# Patient Record
Sex: Male | Born: 2000 | Race: White | Hispanic: No | Marital: Single | Smoking: Current every day smoker
Health system: Southern US, Community
[De-identification: ages and names within clinical notes are randomized; demographics above are authoritative.]

## PROBLEM LIST (undated history)

## (undated) DIAGNOSIS — J45909 Unspecified asthma, uncomplicated: Secondary | ICD-10-CM

## (undated) HISTORY — PX: TONSILLECTOMY: SUR1361

---
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
## 2019-09-28 DIAGNOSIS — Z419 Encounter for procedure for purposes other than remedying health state, unspecified: Secondary | ICD-10-CM | POA: Diagnosis not present

## 2019-10-29 DIAGNOSIS — Z419 Encounter for procedure for purposes other than remedying health state, unspecified: Secondary | ICD-10-CM | POA: Diagnosis not present

## 2019-11-29 DIAGNOSIS — Z419 Encounter for procedure for purposes other than remedying health state, unspecified: Secondary | ICD-10-CM | POA: Diagnosis not present

## 2019-12-29 DIAGNOSIS — Z419 Encounter for procedure for purposes other than remedying health state, unspecified: Secondary | ICD-10-CM | POA: Diagnosis not present

## 2020-01-25 ENCOUNTER — Encounter: Payer: Self-pay | Admitting: Internal Medicine

## 2020-01-25 ENCOUNTER — Ambulatory Visit: Payer: Medicaid Other | Admitting: Internal Medicine

## 2020-01-25 ENCOUNTER — Other Ambulatory Visit: Payer: Self-pay

## 2020-01-25 VITALS — BP 140/92 | HR 85 | Temp 97.9°F | Resp 16 | Ht 73.0 in | Wt 200.5 lb

## 2020-01-25 DIAGNOSIS — J4530 Mild persistent asthma, uncomplicated: Secondary | ICD-10-CM

## 2020-01-25 DIAGNOSIS — F321 Major depressive disorder, single episode, moderate: Secondary | ICD-10-CM | POA: Diagnosis not present

## 2020-01-25 DIAGNOSIS — F419 Anxiety disorder, unspecified: Secondary | ICD-10-CM | POA: Diagnosis not present

## 2020-01-25 DIAGNOSIS — R030 Elevated blood-pressure reading, without diagnosis of hypertension: Secondary | ICD-10-CM

## 2020-01-25 MED ORDER — ESCITALOPRAM OXALATE 10 MG PO TABS: 10.0000 mg | ORAL_TABLET | Freq: Every day | ORAL | 1 refills | Status: DC

## 2020-01-25 NOTE — Patient Instructions (Signed)
Recommend counseling involvement.

## 2020-01-25 NOTE — Progress Notes (Signed)
Patient ID: Sean Fisher, male    DOB: 2000-11-25, 19 y.o.   MRN: [id number]  PCP: Doren Custard, FNP  Chief Complaint  Patient presents with  . Anxiety    Subjective:   Sean Fisher is a 19 y.o. male, presents to clinic with CC of the following:  Chief Complaint  Patient presents with  . Anxiety    HPI:  Patient is a 19 year old male Last visit at [facility] was 07/2017 for a physical Follows up today with anxiety and depressive concerns.  During that evaluation in May 2019, the following was noted:  Patient withdrew from college due to family issues- plans to go to [employer] next semester. Was depressed after break up in April and had decreased appetite is feeling better and got appetite back  His lab results from that visit which included a TSH, CBC, and complete metabolic panel were unremarkable, with the only abnormality a slightly low globulin level noted  He notes today that his symptoms have been going on for about a year now, and had some previously to that, subsided some, although in the past year have again been more problematic.  He finally got the courage to make an appointment and be seen to further discuss. Has not seen counseling or psychiatry in the past.  Not sleeping well, can't fall asleep on many nights. Appetite down, thinks has lost a little weight in recent past.  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 + depressive thoughts, feeling sad at times, occasional crying spells noted Denied thoughts of hurting self or others, has punched walls to help relieve stress. No suicidal thoughts or active plans Overthinks a lot, worries a lot about things, and increases anxiety as a result. Denies heart racing or palpitations, no SOB, not hyperventilate, no numbness or tingling in ext's + Occasional panic attacks when asked, and tries to listen to music to calm down.   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  Has no plans to go back and not motivated to go back.   No FH is aware off of depression/anxiety  Denies any history of hypertension. Family history negative for hypertension as well. He has had no recent chest pains, and denies any significant cardiac history. He does have a history of asthma.  Tobacco-current every day smoker, vapes daily  Denies any supplements, does smoke weed daily, noted this can be relaxing, although also can be a contributor to some depressive and anxiety symptoms. Alcohol - notes only rare use Denies any other drug or supplement use.  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  Also noted the job is not that stressful, not think job is an issue. Lives with GM and uncle, has support, is hard to talk to them about mental health issues, is in a relationship now, could be better, feels like he is often the cause of problems when they arise, has been together for 4.5 years, can talk to partner and has about his mental health and she was supportive of this visit. Not live with mom and does talk to mom regularly and she is supportive and he notes he can talk to her, and she encouraged him to have this visit as well.  Patient Active Problem List   Diagnosis Date Noted  . Keratosis pilaris 06/30/2016  . Preventative health care 06/30/2016  . Asthma  05/05/2016      Current Outpatient Medications:  .  albuterol (PROAIR HFA) 108 (90 Base) MCG/ACT inhaler, Inhale 2 puffs into the lungs every 6 (six) hours as needed for wheezing or shortness of breath., Disp: 1 Inhaler, Rfl: 0 .  QVAR 40 MCG/ACT inhaler, INHALE 2 PUFFS BY MOUTH TWICE A DAY, Disp: 8.7 g, Rfl: 1 .  fluticasone (FLONASE) 50 MCG/ACT nasal spray, USE 1 SPRAY IN NOSTRIL(S) TWICE A DAY AS DIRECTED (Patient not taking: Reported on 01/25/2020), Disp: 1 g, Rfl: 1   Allergies  Allergen Reactions  . Ceftin [Cefuroxime Axetil] Nausea And Vomiting  .  Augmentin [Amoxicillin-Pot Clavulanate] Rash     Past Surgical History:  Procedure Laterality Date  . TONSILLECTOMY       Family History  Problem Relation Age of Onset  . Diabetes Maternal Uncle   . Hypertension Maternal Uncle   . Hyperlipidemia Maternal Uncle   . Heart disease Maternal Grandmother   . Heart attack Maternal Grandmother   . Thyroid disease Maternal Grandmother   . Diabetes Maternal Grandfather   . Arthritis Paternal Grandfather      Social History   Tobacco Use  . Smoking status: Current Every Day Smoker    Types: E-cigarettes  . Smokeless tobacco: Never Used  Substance Use Topics  . Alcohol use: No    Comment: rarely     With staff assistance, above reviewed with the patient today.  ROS: As per HPI, otherwise no specific complaints on a limited and focused system review   No results found for this or any previous visit (from the past 72 hour(s)).   PHQ2/9: [facility] screen [facility] 01/25/2020 08/20/2017 06/15/2017 05/05/2016  Decreased Interest 2 0 0 0  Down, Depressed, Hopeless - 0 0 0  PHQ - 2 Score 2 0 0 0  Altered sleeping 3 0 - -  Tired, decreased energy 3 0 - -  Change in appetite 2 0 - -  Feeling bad or failure about yourself  2 0 - -  Trouble concentrating 0 0 - -  Moving slowly or fidgety/restless 0 0 - -  Suicidal thoughts 2 0 - -  PHQ-9 Score 14 0 - -  Difficult doing work/chores Somewhat difficult - - -   PHQ-2/9 Result reviewed, upon discussion, has no active suicidal thoughts, at times has punched a wall to relieve stress, without any major injury having resulted in the past from this.  GAD 7 : Generalized Anxiety Score 01/25/2020 08/20/2017  Nervous, Anxious, on Edge 3 0  Control/stop worrying 3 0  Worry too much - different things 3 0  Trouble relaxing 3 0  Restless 1 0  Easily annoyed or irritable 3 0  Afraid - awful might happen 2 0  Total GAD 7 Score 18 0  Anxiety Difficulty Very difficult Not difficult at all     GAD7 reviewed   Fall Risk: Fall Risk  01/25/2020 08/20/2017 06/15/2017  Falls in the past year? 0 No No  Number falls in past yr: 0 - -  Injury with Fall? 1 - -      Objective:   Vitals:   01/25/20 1006  BP: (!) 140/92  Pulse: 85  Resp: 16  Temp: 97.9 F (36.6 C)  TempSrc: Oral  Weight: 200 lb 8 oz (90.9 kg)  Height: 6\' 1"  (1.854 m)  PF: 98 L/min    Body mass index is 26.45 kg/m. Recheck blood pressure by myself was 124/84 on the left  with an adult cuff.  Physical Exam   NAD, masked, very polite and pleasant, looks well HEENT - [place]/AT, sclera anicteric, PERRL, EOMI, conj - non-inj'ed,  pharynx clear Neck - supple, no adenopathy, no TM, carotids 2+ and = without bruits bilat Car - RRR without m/g/r Pulm- RR and effort normal at rest, CTA without wheeze or rales Abd - soft, NT, ND, BS+,  no masses, no obvious HSM Back - no CVA tenderness Skin- no rash noted on exposed areas, positive tattoo left upper extremity Ext - no LE edema,  Neuro/psychiatric - affect was not flat, very appropriate with conversation  Alert and oriented  Grossly non-focal - good strength on testing extremities, sensation intact to LT in distal extremities, deep tendon reflexes 2+ and equal in the patella  Speech and gait are normal   Results for orders placed or performed in visit on 08/20/17  COMPLETE METABOLIC PANEL WITH GFR  Result Value Ref Range   Glucose, Bld 90 65 - 99 mg/dL   BUN 7 7 - 20 mg/dL   Creat 0.35 5.97 - 4.16 mg/dL   GFR, Est Non African American 113 > OR = 60 mL/min/1.25m2   GFR, Est African American 131 > OR = 60 mL/min/1.37m2   BUN/Creatinine Ratio NOT APPLICABLE 6 - 22 (calc)   Sodium 140 135 - 146 mmol/L   Potassium 4.6 3.8 - 5.1 mmol/L   Chloride 103 98 - 110 mmol/L   CO2 30 20 - 32 mmol/L   Calcium 9.9 8.9 - 10.4 mg/dL   Total Protein 7.0 6.3 - 8.2 g/dL   Albumin 5.1 3.6 - 5.1 g/dL   Globulin 1.9 (L) 2.1 - 3.5 g/dL (calc)   AG Ratio 2.7 (H) 1.0 - 2.5 (calc)    Total Bilirubin 0.5 0.2 - 1.1 mg/dL   Alkaline phosphatase (APISO) 68 48 - 230 U/L   AST 16 12 - 32 U/L   ALT 9 8 - 46 U/L  CBC  Result Value Ref Range   WBC 5.3 3.8 - 10.8 Thousand/uL   RBC 5.45 4.20 - 5.80 Million/uL   Hemoglobin 16.5 13.2 - 17.1 g/dL   HCT 38.4 38 - 50 %   MCV 88.8 80.0 - 100.0 fL   MCH 30.3 27.0 - 33.0 pg   MCHC 34.1 32.0 - 36.0 g/dL   RDW 53.6 46.8 - 03.2 %   Platelets 240 140 - 400 Thousand/uL   MPV 10.5 7.5 - 12.5 fL  TSH  Result Value Ref Range   TSH 1.34 0.50 - 4.30 mIU/L   Prior labs reviewed.    Assessment & Plan:   1. Elevated BP without diagnosis of hypertension His first blood pressure today was slightly elevated, although obtained right after he was taken back to the room and sat down.  Recheck blood pressure was good at 124/84.  He has no history of hypertension, no family history of hypertension noted.  No recent cardiac symptoms of concern. Doubt his blood pressure is high on average, and will continue to monitor presently.   2. Anxiety 3. Current moderate episode of major depressive disorder without prior episode [facility]) Patient with both anxiety and depression, with his PHQ-9 and GAD-7 scores reviewed with him today.  He denied any suicidal concerns, with occasional hurting self by punching walls to relieve stress, and notes that is not a good way to try to relieve stress Did have a TSH with part of his prior lab results previously which was normal.  Discussed counseling and how this with medicines works better than either alone and do feel is important to include in the management and patient agreed with some of the community resources available provided.  Did recommend he contact his insurance carrier to help as to which may be helped with coverage  Discussed medicines that are often helpful and reviewed potential benefits and SE's. Agreed to initiate a SSRI and noted it often takes weeks to fully evaluate the benefit of this medicine and patient  was understanding of this. Also noted may cause some upset stomach and GI SE's very early, but if continue, they usually go away after the first week or two.  Started Lexapro-10 mg, as prescribed below.  Will f/u in about 4 weeks, sooner prn noted. Also, can follow up sooner as needed.  Did spend time counseling today, and reassuring him that coming today was important and a very good decision, and hope with the counseling and medications, improvement will occur.  - escitalopram (LEXAPRO) 10 MG tablet; Take 1 tablet (10 mg total) by mouth daily. Can take 1/2 a tablet for first week  Dispense: 30 tablet; Refill: 1  4. Mild persistent asthma without complication Has been stable in the recent past.   Will schedule follow-up in 4 weeks time, follow-up sooner as needed. Await his response to the Lexapro addition and hopes of counseling involvement as well in the near future.   Jamelle Haring, MD 01/25/20 10:13 AM

## 2020-01-29 DIAGNOSIS — Z419 Encounter for procedure for purposes other than remedying health state, unspecified: Secondary | ICD-10-CM | POA: Diagnosis not present

## 2020-02-26 NOTE — Progress Notes (Signed)
Patient ID: Sean Fisher, male    DOB: Aug 04, 2000, 19 y.o.   MRN: [id number]  PCP: Jamelle Haring, MD  Chief Complaint  Patient presents with  . Follow-up    Subjective:   Sean Fisher is a 19 y.o. male, presents to clinic with CC of the following:  Chief Complaint  Patient presents with  . Follow-up   Patient is a 19 year old male Last visit was 01/25/20 Follows up today after Lexapro started at his last visit  He noted he likes the medication he is taking presently.  Anxiety/depression - symptoms have been going on for about a year now, and had some previously to that, subsided some, although in the past year have again been more problematic.  He finally got the courage to make an appointment for our last visit and be seen to further discuss. Has not seen counseling since our last visit, not explored yet Has started the Lexapro, and noted does feel like makes a difference, states the likes the medicine.   Notes all in all, is feeling better.  still can't fall asleep on many nights,  Has tried melatonin gummies before and not helped much by them. Appetite better. 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 less depressive thoughts, no further crying spells noted Denied thoughts of hurting self or others, has not punched walls to help relieve stress like prior. No suicidal thoughts or active plans Overthinks a lot, although notes is better, still worries about things at times Denies heart racing or palpitations, no SOB, not hyperventilate, no numbness or tingling in ext's No panic attacks when asked, and tries to listen to music to calm down.   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  Has no plans to go back and not motivated to go back.   No FH of depression/anxiety  Denies any history of hypertension. Family history negative for hypertension  as well. He has had no recent chest pains, and denies any significant cardiac history. He does have a history of asthma.  Tobacco-current every day smoker, vapes daily  Denies any supplements, does smoke weed almost daily, noted this can be relaxing, although also can be a contributor to some depressive and anxiety symptoms noted previously. Alcohol - not since started med Denies any other drug or supplement use.  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  Also noted the job is not that stressful, not think job is an issue. Lives with GM and uncle, has support, is hard to talk to them about mental health issues, is in a relationship now, could be better, feels like he is often the cause of problems when they arise, has been together for 4.5 years, can talk to partner and has about his mental health and she was supportive of this visit. Not live with mom and does talk to mom regularly and she is supportive and he notes he can talk to her     Patient Active Problem List   Diagnosis Date Noted  . Current moderate episode of major depressive disorder (HCC) 01/25/2020  . Anxiety 01/25/2020  . Elevated BP without diagnosis of hypertension 01/25/2020  . Keratosis pilaris 06/30/2016  . Preventative health care 06/30/2016  . Asthma 05/05/2016      Current Outpatient Medications:  .  albuterol (PROAIR HFA) 108 (90 Base) MCG/ACT inhaler, Inhale 2  puffs into the lungs every 6 (six) hours as needed for wheezing or shortness of breath., Disp: 1 Inhaler, Rfl: 0 .  escitalopram (LEXAPRO) 10 MG tablet, Take 1 tablet (10 mg total) by mouth daily. Can take 1/2 a tablet for first week, Disp: 30 tablet, Rfl: 1 .  fluticasone (FLONASE) 50 MCG/ACT nasal spray, USE 1 SPRAY IN NOSTRIL(S) TWICE A DAY AS DIRECTED, Disp: 1 g, Rfl: 1 .  QVAR 40 MCG/ACT inhaler, INHALE 2 PUFFS BY MOUTH TWICE A DAY, Disp: 8.7 g, Rfl: 1   Allergies  Allergen Reactions  . Ceftin [Cefuroxime Axetil] Nausea  And Vomiting  . Augmentin [Amoxicillin-Pot Clavulanate] Rash     Past Surgical History:  Procedure Laterality Date  . TONSILLECTOMY       Family History  Problem Relation Age of Onset  . Diabetes Maternal Uncle   . Hypertension Maternal Uncle   . Hyperlipidemia Maternal Uncle   . Heart disease Maternal Grandmother   . Heart attack Maternal Grandmother   . Thyroid disease Maternal Grandmother   . Diabetes Maternal Grandfather   . Arthritis Paternal Grandfather      Social History   Tobacco Use  . Smoking status: Current Every Day Smoker    Types: E-cigarettes  . Smokeless tobacco: Never Used  Substance Use Topics  . Alcohol use: No    Comment: rarely     With staff assistance, above reviewed with the patient today.  ROS: As per HPI, otherwise no specific complaints on a limited and focused system review   No results found for this or any previous visit (from the past 72 hour(s)).   PHQ2/9: Depression screen [facility] 02/27/2020 01/25/2020 08/20/2017 06/15/2017 05/05/2016  Decreased Interest 1 2 0 0 0  Down, Depressed, Hopeless 1 - 0 0 0  PHQ - 2 Score 2 2 0 0 0  Altered sleeping 2 3 0 - -  Tired, decreased energy 2 3 0 - -  Change in appetite 1 2 0 - -  Feeling bad or failure about yourself  1 2 0 - -  Trouble concentrating 0 0 0 - -  Moving slowly or fidgety/restless 0 0 0 - -  Suicidal thoughts 0 2 0 - -  PHQ-9 Score 8 14 0 - -  Difficult doing work/chores Somewhat difficult Somewhat difficult - - -   PHQ-2/9 Result reviewed   GAD 7 : Generalized Anxiety Score 02/27/2020 01/25/2020 08/20/2017  Nervous, Anxious, on Edge 2 3 0  Control/stop worrying 2 3 0  Worry too much - different things 3 3 0  Trouble relaxing 1 3 0  Restless 1 1 0  Easily annoyed or irritable 1 3 0  Afraid - awful might happen 0 2 0  Total GAD 7 Score 10 18 0  Anxiety Difficulty Somewhat difficult Very difficult Not difficult at all    Result reviewed  Fall Risk: Fall Risk   02/27/2020 01/25/2020 08/20/2017 06/15/2017  Falls in the past year? 0 0 No No  Number falls in past yr: 0 0 - -  Injury with Fall? 0 1 - -      Objective:   Vitals:   02/27/20 1046  BP: 100/70  Pulse: 65  Resp: 16  Temp: 98.5 F (36.9 C)  TempSrc: Oral  SpO2: 97%  Weight: 200 lb (90.7 kg)  Height: 6\' 1"  (1.854 m)    Body mass index is 26.39 kg/m.  Physical Exam    NAD, masked, very polite and  pleasant, looks well, very positive with his responses. HEENT - [place]/AT, sclera anicteric, PERRL,  conj - non-inj'ed,  Neck - supple, no adenopathy Car - RRR without m/g/r Pulm- RR and effort normal at rest, CTA without wheeze or rales Abd - soft, NT diffusely Back - no CVA tenderness Ext - no LE edema,  Neuro/psychiatric - affect was not flat, very appropriate with conversation             Alert and oriented             Grossly non-focal              Speech normal  Results for orders placed or performed in visit on 08/20/17  COMPLETE METABOLIC PANEL WITH GFR  Result Value Ref Range   Glucose, Bld 90 65 - 99 mg/dL   BUN 7 7 - 20 mg/dL   Creat 7.16 9.67 - 8.93 mg/dL   GFR, Est Non African American 113 > OR = 60 mL/min/1.63m2   GFR, Est African American 131 > OR = 60 mL/min/1.25m2   BUN/Creatinine Ratio NOT APPLICABLE 6 - 22 (calc)   Sodium 140 135 - 146 mmol/L   Potassium 4.6 3.8 - 5.1 mmol/L   Chloride 103 98 - 110 mmol/L   CO2 30 20 - 32 mmol/L   Calcium 9.9 8.9 - 10.4 mg/dL   Total Protein 7.0 6.3 - 8.2 g/dL   Albumin 5.1 3.6 - 5.1 g/dL   Globulin 1.9 (L) 2.1 - 3.5 g/dL (calc)   AG Ratio 2.7 (H) 1.0 - 2.5 (calc)   Total Bilirubin 0.5 0.2 - 1.1 mg/dL   Alkaline phosphatase (APISO) 68 48 - 230 U/L   AST 16 12 - 32 U/L   ALT 9 8 - 46 U/L  CBC  Result Value Ref Range   WBC 5.3 3.8 - 10.8 Thousand/uL   RBC 5.45 4.20 - 5.80 Million/uL   Hemoglobin 16.5 13.2 - 17.1 g/dL   HCT 81.0 38 - 50 %   MCV 88.8 80.0 - 100.0 fL   MCH 30.3 27.0 - 33.0 pg   MCHC 34.1 32.0 - 36.0  g/dL   RDW 17.5 10.2 - 58.5 %   Platelets 240 140 - 400 Thousand/uL   MPV 10.5 7.5 - 12.5 fL  TSH  Result Value Ref Range   TSH 1.34 0.50 - 4.30 mIU/L       Assessment & Plan:     1. Elevated BP without diagnosis of hypertension Follow up BP's today were good  He has no history of hypertension, no family history of hypertension noted.  No recent cardiac symptoms of concern. Doubt his blood pressure is high on average, and will continue to monitor presently.  2. Anxiety 3. Current moderate episode of major depressive disorder without prior episode [facility]) Patient with both anxiety and depression, with his PHQ-9 and GAD-7 results reviewed, both note improvement. Counseling was rec'ed after last visit as well, although he has not yet pursued that to date.  Did not push to pursue that further today, although again noted to him that the 2 together, counseling and medications have been reported to work better than either alone.  Lexapro initiated and noted has been helpful.  Denies concerning side effects. Do feel continuing the Lexapro is important, and refilled that for him today. Do feel this will continue to be helpful, and possibly even further improved more with the current Lexapro dose.       4. Need for  immunization against influenza  - Flu Vaccine QUAD 36+ mos IM  Will schedule follow-up in 3-4 months time, follow-up sooner as needed. Informed him that his f/u visit will be with a new provider as I will be leaving the practice prior to his planned f/u visit     Jamelle Haring, MD 02/27/20 10:56 AM

## 2020-02-27 ENCOUNTER — Ambulatory Visit: Payer: Medicaid Other | Admitting: Internal Medicine

## 2020-02-27 ENCOUNTER — Other Ambulatory Visit: Payer: Self-pay

## 2020-02-27 ENCOUNTER — Encounter: Payer: Self-pay | Admitting: Internal Medicine

## 2020-02-27 VITALS — BP 100/70 | HR 65 | Temp 98.5°F | Resp 16 | Ht 73.0 in | Wt 200.0 lb

## 2020-02-27 DIAGNOSIS — F321 Major depressive disorder, single episode, moderate: Secondary | ICD-10-CM | POA: Diagnosis not present

## 2020-02-27 DIAGNOSIS — F419 Anxiety disorder, unspecified: Secondary | ICD-10-CM | POA: Diagnosis not present

## 2020-02-27 DIAGNOSIS — R030 Elevated blood-pressure reading, without diagnosis of hypertension: Secondary | ICD-10-CM

## 2020-02-27 DIAGNOSIS — Z23 Encounter for immunization: Secondary | ICD-10-CM

## 2020-02-27 MED ORDER — ESCITALOPRAM OXALATE 10 MG PO TABS: 10.0000 mg | ORAL_TABLET | Freq: Every day | ORAL | 1 refills | Status: AC

## 2020-02-28 DIAGNOSIS — Z419 Encounter for procedure for purposes other than remedying health state, unspecified: Secondary | ICD-10-CM | POA: Diagnosis not present

## 2020-03-11 DIAGNOSIS — Z20822 Contact with and (suspected) exposure to covid-19: Secondary | ICD-10-CM | POA: Diagnosis not present

## 2020-03-30 DIAGNOSIS — Z419 Encounter for procedure for purposes other than remedying health state, unspecified: Secondary | ICD-10-CM | POA: Diagnosis not present

## 2020-04-23 DIAGNOSIS — Z20822 Contact with and (suspected) exposure to covid-19: Secondary | ICD-10-CM | POA: Diagnosis not present

## 2020-04-30 DIAGNOSIS — Z419 Encounter for procedure for purposes other than remedying health state, unspecified: Secondary | ICD-10-CM | POA: Diagnosis not present

## 2020-05-28 DIAGNOSIS — Z419 Encounter for procedure for purposes other than remedying health state, unspecified: Secondary | ICD-10-CM | POA: Diagnosis not present

## 2020-06-28 DIAGNOSIS — Z419 Encounter for procedure for purposes other than remedying health state, unspecified: Secondary | ICD-10-CM | POA: Diagnosis not present

## 2020-07-28 DIAGNOSIS — Z419 Encounter for procedure for purposes other than remedying health state, unspecified: Secondary | ICD-10-CM | POA: Diagnosis not present

## 2020-08-19 DIAGNOSIS — M654 Radial styloid tenosynovitis [de Quervain]: Secondary | ICD-10-CM | POA: Diagnosis not present

## 2020-08-28 DIAGNOSIS — Z419 Encounter for procedure for purposes other than remedying health state, unspecified: Secondary | ICD-10-CM | POA: Diagnosis not present

## 2020-09-04 DIAGNOSIS — M654 Radial styloid tenosynovitis [de Quervain]: Secondary | ICD-10-CM | POA: Diagnosis not present

## 2020-09-27 DIAGNOSIS — Z419 Encounter for procedure for purposes other than remedying health state, unspecified: Secondary | ICD-10-CM | POA: Diagnosis not present

## 2020-10-28 DIAGNOSIS — Z419 Encounter for procedure for purposes other than remedying health state, unspecified: Secondary | ICD-10-CM | POA: Diagnosis not present

## 2020-11-28 DIAGNOSIS — Z419 Encounter for procedure for purposes other than remedying health state, unspecified: Secondary | ICD-10-CM | POA: Diagnosis not present

## 2020-12-28 DIAGNOSIS — Z419 Encounter for procedure for purposes other than remedying health state, unspecified: Secondary | ICD-10-CM | POA: Diagnosis not present

## 2021-01-28 DIAGNOSIS — Z419 Encounter for procedure for purposes other than remedying health state, unspecified: Secondary | ICD-10-CM | POA: Diagnosis not present

## 2021-02-23 DIAGNOSIS — J101 Influenza due to other identified influenza virus with other respiratory manifestations: Secondary | ICD-10-CM | POA: Diagnosis not present

## 2021-02-23 DIAGNOSIS — R6883 Chills (without fever): Secondary | ICD-10-CM | POA: Diagnosis not present

## 2021-02-27 DIAGNOSIS — Z419 Encounter for procedure for purposes other than remedying health state, unspecified: Secondary | ICD-10-CM | POA: Diagnosis not present

## 2021-03-30 DIAGNOSIS — Z419 Encounter for procedure for purposes other than remedying health state, unspecified: Secondary | ICD-10-CM | POA: Diagnosis not present

## 2021-04-30 DIAGNOSIS — Z419 Encounter for procedure for purposes other than remedying health state, unspecified: Secondary | ICD-10-CM | POA: Diagnosis not present

## 2021-05-28 DIAGNOSIS — Z419 Encounter for procedure for purposes other than remedying health state, unspecified: Secondary | ICD-10-CM | POA: Diagnosis not present

## 2021-06-23 DIAGNOSIS — J069 Acute upper respiratory infection, unspecified: Secondary | ICD-10-CM | POA: Diagnosis not present

## 2021-06-28 DIAGNOSIS — Z419 Encounter for procedure for purposes other than remedying health state, unspecified: Secondary | ICD-10-CM | POA: Diagnosis not present

## 2021-07-28 DIAGNOSIS — Z419 Encounter for procedure for purposes other than remedying health state, unspecified: Secondary | ICD-10-CM | POA: Diagnosis not present

## 2021-08-28 DIAGNOSIS — Z419 Encounter for procedure for purposes other than remedying health state, unspecified: Secondary | ICD-10-CM | POA: Diagnosis not present

## 2021-09-27 DIAGNOSIS — Z419 Encounter for procedure for purposes other than remedying health state, unspecified: Secondary | ICD-10-CM | POA: Diagnosis not present

## 2021-10-28 DIAGNOSIS — Z419 Encounter for procedure for purposes other than remedying health state, unspecified: Secondary | ICD-10-CM | POA: Diagnosis not present

## 2021-11-28 DIAGNOSIS — Z419 Encounter for procedure for purposes other than remedying health state, unspecified: Secondary | ICD-10-CM | POA: Diagnosis not present

## 2021-12-28 DIAGNOSIS — Z419 Encounter for procedure for purposes other than remedying health state, unspecified: Secondary | ICD-10-CM | POA: Diagnosis not present

## 2022-01-28 DIAGNOSIS — Z419 Encounter for procedure for purposes other than remedying health state, unspecified: Secondary | ICD-10-CM | POA: Diagnosis not present

## 2022-02-27 DIAGNOSIS — Z419 Encounter for procedure for purposes other than remedying health state, unspecified: Secondary | ICD-10-CM | POA: Diagnosis not present

## 2022-03-30 DIAGNOSIS — Z419 Encounter for procedure for purposes other than remedying health state, unspecified: Secondary | ICD-10-CM | POA: Diagnosis not present

## 2022-04-30 DIAGNOSIS — Z419 Encounter for procedure for purposes other than remedying health state, unspecified: Secondary | ICD-10-CM | POA: Diagnosis not present

## 2022-05-29 DIAGNOSIS — Z419 Encounter for procedure for purposes other than remedying health state, unspecified: Secondary | ICD-10-CM | POA: Diagnosis not present

## 2022-06-29 DIAGNOSIS — Z419 Encounter for procedure for purposes other than remedying health state, unspecified: Secondary | ICD-10-CM | POA: Diagnosis not present

## 2022-07-29 DIAGNOSIS — Z419 Encounter for procedure for purposes other than remedying health state, unspecified: Secondary | ICD-10-CM | POA: Diagnosis not present

## 2022-08-29 DIAGNOSIS — Z419 Encounter for procedure for purposes other than remedying health state, unspecified: Secondary | ICD-10-CM | POA: Diagnosis not present

## 2022-09-28 DIAGNOSIS — Z419 Encounter for procedure for purposes other than remedying health state, unspecified: Secondary | ICD-10-CM | POA: Diagnosis not present
